# Patient Record
Sex: Female | Born: 1995 | Race: White | Hispanic: No | Marital: Single | Smoking: Former smoker
Health system: Southern US, Community
[De-identification: ages and names within clinical notes are randomized; demographics above are authoritative.]

---
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
## 2017-05-06 DIAGNOSIS — H52222 Regular astigmatism, left eye: Secondary | ICD-10-CM | POA: Diagnosis not present

## 2017-05-06 DIAGNOSIS — H5213 Myopia, bilateral: Secondary | ICD-10-CM | POA: Diagnosis not present

## 2017-08-05 ENCOUNTER — Encounter (HOSPITAL_COMMUNITY): Payer: Self-pay

## 2018-09-08 DIAGNOSIS — Z3A12 12 weeks gestation of pregnancy: Secondary | ICD-10-CM | POA: Diagnosis not present

## 2018-09-08 DIAGNOSIS — Z3689 Encounter for other specified antenatal screening: Secondary | ICD-10-CM | POA: Diagnosis not present

## 2018-09-08 DIAGNOSIS — O09291 Supervision of pregnancy with other poor reproductive or obstetric history, first trimester: Secondary | ICD-10-CM | POA: Diagnosis not present

## 2018-09-08 DIAGNOSIS — Z3682 Encounter for antenatal screening for nuchal translucency: Secondary | ICD-10-CM | POA: Diagnosis not present

## 2018-09-08 DIAGNOSIS — O3680X0 Pregnancy with inconclusive fetal viability, not applicable or unspecified: Secondary | ICD-10-CM | POA: Diagnosis not present

## 2018-10-06 DIAGNOSIS — Z3689 Encounter for other specified antenatal screening: Secondary | ICD-10-CM | POA: Diagnosis not present

## 2018-10-28 DIAGNOSIS — Z3A20 20 weeks gestation of pregnancy: Secondary | ICD-10-CM | POA: Diagnosis not present

## 2018-10-29 DIAGNOSIS — O09292 Supervision of pregnancy with other poor reproductive or obstetric history, second trimester: Secondary | ICD-10-CM | POA: Diagnosis not present

## 2018-10-29 DIAGNOSIS — Z363 Encounter for antenatal screening for malformations: Secondary | ICD-10-CM | POA: Diagnosis not present

## 2018-10-29 DIAGNOSIS — Z3A20 20 weeks gestation of pregnancy: Secondary | ICD-10-CM | POA: Diagnosis not present

## 2018-11-26 DIAGNOSIS — Z3689 Encounter for other specified antenatal screening: Secondary | ICD-10-CM | POA: Diagnosis not present

## 2018-11-26 DIAGNOSIS — O2686 Pruritic urticarial papules and plaques of pregnancy (PUPPP): Secondary | ICD-10-CM | POA: Diagnosis not present

## 2018-12-25 DIAGNOSIS — O09293 Supervision of pregnancy with other poor reproductive or obstetric history, third trimester: Secondary | ICD-10-CM | POA: Diagnosis not present

## 2018-12-25 DIAGNOSIS — Z3A28 28 weeks gestation of pregnancy: Secondary | ICD-10-CM | POA: Diagnosis not present

## 2018-12-25 DIAGNOSIS — Z3689 Encounter for other specified antenatal screening: Secondary | ICD-10-CM | POA: Diagnosis not present

## 2019-01-29 DIAGNOSIS — Z23 Encounter for immunization: Secondary | ICD-10-CM | POA: Diagnosis not present

## 2019-01-29 DIAGNOSIS — F331 Major depressive disorder, recurrent, moderate: Secondary | ICD-10-CM | POA: Diagnosis not present

## 2019-01-29 DIAGNOSIS — G43109 Migraine with aura, not intractable, without status migrainosus: Secondary | ICD-10-CM | POA: Diagnosis not present

## 2019-01-29 DIAGNOSIS — E162 Hypoglycemia, unspecified: Secondary | ICD-10-CM | POA: Diagnosis not present

## 2019-02-19 DIAGNOSIS — O99343 Other mental disorders complicating pregnancy, third trimester: Secondary | ICD-10-CM | POA: Diagnosis not present

## 2019-02-19 DIAGNOSIS — F419 Anxiety disorder, unspecified: Secondary | ICD-10-CM | POA: Diagnosis not present

## 2019-02-19 DIAGNOSIS — Z3A36 36 weeks gestation of pregnancy: Secondary | ICD-10-CM | POA: Diagnosis not present

## 2019-02-19 DIAGNOSIS — O09293 Supervision of pregnancy with other poor reproductive or obstetric history, third trimester: Secondary | ICD-10-CM | POA: Diagnosis not present

## 2019-02-26 DIAGNOSIS — O99820 Streptococcus B carrier state complicating pregnancy: Secondary | ICD-10-CM | POA: Diagnosis not present

## 2019-02-26 DIAGNOSIS — Z3A37 37 weeks gestation of pregnancy: Secondary | ICD-10-CM | POA: Diagnosis not present

## 2019-02-26 DIAGNOSIS — O368130 Decreased fetal movements, third trimester, not applicable or unspecified: Secondary | ICD-10-CM | POA: Diagnosis not present

## 2019-03-09 DIAGNOSIS — O99820 Streptococcus B carrier state complicating pregnancy: Secondary | ICD-10-CM | POA: Diagnosis not present

## 2019-03-09 DIAGNOSIS — Z3689 Encounter for other specified antenatal screening: Secondary | ICD-10-CM | POA: Diagnosis not present

## 2019-03-09 DIAGNOSIS — O09293 Supervision of pregnancy with other poor reproductive or obstetric history, third trimester: Secondary | ICD-10-CM | POA: Diagnosis not present

## 2019-03-09 DIAGNOSIS — Z3A39 39 weeks gestation of pregnancy: Secondary | ICD-10-CM | POA: Diagnosis not present

## 2019-03-12 DIAGNOSIS — O99820 Streptococcus B carrier state complicating pregnancy: Secondary | ICD-10-CM | POA: Diagnosis not present

## 2019-03-12 DIAGNOSIS — Z3A39 39 weeks gestation of pregnancy: Secondary | ICD-10-CM | POA: Diagnosis not present

## 2019-03-12 DIAGNOSIS — O1404 Mild to moderate pre-eclampsia, complicating childbirth: Secondary | ICD-10-CM | POA: Diagnosis not present

## 2019-04-23 DIAGNOSIS — Z30430 Encounter for insertion of intrauterine contraceptive device: Secondary | ICD-10-CM | POA: Diagnosis not present

## 2019-08-06 ENCOUNTER — Emergency Department (INDEPENDENT_AMBULATORY_CARE_PROVIDER_SITE_OTHER)
Admission: EM | Admit: 2019-08-06 | Discharge: 2019-08-06 | Disposition: A | Discharge status: Home / Self Care | Payer: Medicaid Other | Source: Home / Self Care

## 2019-08-06 ENCOUNTER — Other Ambulatory Visit: Payer: Self-pay

## 2019-08-06 ENCOUNTER — Encounter: Payer: Self-pay | Admitting: *Deleted

## 2019-08-06 ENCOUNTER — Emergency Department (INDEPENDENT_AMBULATORY_CARE_PROVIDER_SITE_OTHER): Payer: Medicaid Other

## 2019-08-06 DIAGNOSIS — M79672 Pain in left foot: Secondary | ICD-10-CM | POA: Diagnosis not present

## 2019-08-06 LAB — POCT URINE PREGNANCY: Preg Test, Ur: NEGATIVE

## 2019-08-06 NOTE — ED Provider Notes (Signed)
[facility]    CSN: [id number] Arrival date & time: 08/06/19  1406      History   Chief Complaint Chief Complaint  Patient presents with  . Foot Pain    HPI Mia Marsh is a 23 y.o. female.   HPI  Mia Marsh is a 23 y.o. female presenting to UC with c/o left foot pain that started about 2 days ago. Pain is aching and sore. Denies pain at rest but 4/10 with movement. No known injury but she reports returning back to work at [employer] recently since being on maternity leave. She is on her feel for long hours at a time and believes that is what has caused current pain.  She has not tried anything for her pain yet.    History reviewed. No pertinent past medical history.  Patient Active Problem List   Diagnosis Date Noted  . 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 No pertinent surgical history.  OB History    Gravida  1   Para      Term      Preterm      AB      Living        SAB      TAB      Ectopic      Multiple      Live Births               Home Medications    Prior to Admission medications   Medication Sig Start Date End Date Taking? Authorizing Provider  escitalopram (LEXAPRO) 20 MG tablet Take 20 mg by mouth daily. 04/07/19   [provider]    Family History History reviewed. No pertinent family history.  Social History Social History   Tobacco Use  . Smoking status: Former Games developer  . Smokeless tobacco: Never Used  Substance Use Topics  . Alcohol use: Never    Frequency: Never  . Drug use: Never     Allergies   Amoxicillin   Review of Systems Review of Systems  Musculoskeletal: Positive for arthralgias. Negative for gait problem, joint swelling and myalgias.  Skin: Negative for color change and wound.  Neurological: Negative for weakness and numbness.     Physical Exam Triage Vital Signs ED Triage Vitals [08/06/19 1429]  Enc Vitals Group     BP 103/70     Pulse Rate 82     Resp  16     Temp 98.5 F (36.9 C)     Temp Source Oral     SpO2 99 %     Weight 146 lb (66.2 kg)     Height 5\' 2"  (1.575 m)     Head Circumference      Peak Flow      Pain Score 4     Pain Loc      Pain Edu?      Excl. in GC?    No data found.  Updated Vital Signs BP 103/70 (BP Location: Right Arm)   Pulse 82   Temp 98.5 F (36.9 C) (Oral)   Resp 16   Ht 5\' 2"  (1.575 m)   Wt 146 lb (66.2 kg)   SpO2 99%   BMI 26.70 kg/m   Visual Acuity Right Eye Distance:   Left Eye Distance:   Bilateral Distance:    Right Eye Near:   Left Eye Near:    Bilateral Near:     Physical Exam Vitals signs and  nursing note reviewed.  Constitutional:      Appearance: Normal appearance. She is well-developed.  HENT:     Head: Normocephalic and atraumatic.  Neck:     Musculoskeletal: Normal range of motion.  Cardiovascular:     Rate and Rhythm: Normal rate.  Pulmonary:     Effort: Pulmonary effort is normal.  Musculoskeletal: Normal range of motion.        General: Tenderness present. No swelling.     Left foot: Normal range of motion. Bony tenderness present. No swelling, crepitus or deformity.       Feet:  Skin:    General: Skin is warm and dry.     Capillary Refill: Capillary refill takes less than 2 seconds.     Findings: No bruising or rash.  Neurological:     Mental Status: She is alert and oriented to person, place, and time.     Sensory: No sensory deficit.  Psychiatric:        Behavior: Behavior normal.      UC Treatments / Results  Labs (all labs ordered are listed, but only abnormal results are displayed) Labs Reviewed  POCT URINE PREGNANCY    EKG   Radiology Dg Foot Complete Left  Result Date: 08/06/2019 CLINICAL DATA:  Pain EXAM: LEFT FOOT - COMPLETE 3+ VIEW COMPARISON:  None. FINDINGS: Frontal, oblique, and lateral views were obtained. There is no evident fracture or dislocation. Joint spaces appear normal. No erosive change. No radiopaque foreign body.  IMPRESSION: No fracture or dislocation.  No evident arthropathy. Electronically Signed   By: Lowella Grip III M.D.   On: 08/06/2019 15:11    Procedures Procedures (including critical care time)  Medications Ordered in UC Medications - No data to display  Initial Impression / Assessment and Plan / UC Course  I have reviewed the triage vital signs and the nursing notes.  Pertinent labs & imaging results that were available during my care of the patient were reviewed by me and considered in my medical decision making (see chart for details).     Reassured pt of normal imaging No evidence of infection Pain likely from overuse injury Encouraged conservative tx F/u with PCP or Sports Medicine in 1-2 weeks if not improving.  Final Clinical Impressions(s) / UC Diagnoses   Final diagnoses:  Foot pain, left     Discharge Instructions      You may take 500mg  acetaminophen every 4-6 hours or in combination with ibuprofen 400-600mg  every 6-8 hours as needed for pain and inflammation.   Call to follow up with family medicine or sports medicine in 1-2 weeks if not improving using the phone number below.      ED Prescriptions    None     PDMP not reviewed this encounter.   [name], [place] 08/06/19 1643

## 2019-08-06 NOTE — Discharge Instructions (Signed)
°  You may take 500mg  acetaminophen every 4-6 hours or in combination with ibuprofen 400-600mg  every 6-8 hours as needed for pain and inflammation.   Call to follow up with family medicine or sports medicine in 1-2 weeks if not improving using the phone number below.

## 2019-08-06 NOTE — ED Triage Notes (Signed)
Pt c/o LT foot pain x 2 days. Denies injury.

## 2020-02-05 DIAGNOSIS — N925 Other specified irregular menstruation: Secondary | ICD-10-CM | POA: Diagnosis not present

## 2020-02-05 DIAGNOSIS — R5383 Other fatigue: Secondary | ICD-10-CM | POA: Diagnosis not present

## 2020-02-05 DIAGNOSIS — Z3202 Encounter for pregnancy test, result negative: Secondary | ICD-10-CM | POA: Diagnosis not present

## 2020-02-05 DIAGNOSIS — Z30011 Encounter for initial prescription of contraceptive pills: Secondary | ICD-10-CM | POA: Diagnosis not present

## 2020-02-05 DIAGNOSIS — R110 Nausea: Secondary | ICD-10-CM | POA: Diagnosis not present

## 2020-02-05 DIAGNOSIS — Z30014 Encounter for initial prescription of intrauterine contraceptive device: Secondary | ICD-10-CM | POA: Diagnosis not present

## 2020-02-05 DIAGNOSIS — N926 Irregular menstruation, unspecified: Secondary | ICD-10-CM | POA: Diagnosis not present

## 2020-02-13 IMAGING — DX DG FOOT COMPLETE 3+V*L*
3 series · 3 of 3 positions shown · non-contrast
Comparison: None.

CLINICAL DATA: Pain

EXAM:
LEFT FOOT - COMPLETE 3+ VIEW

[foot ap]
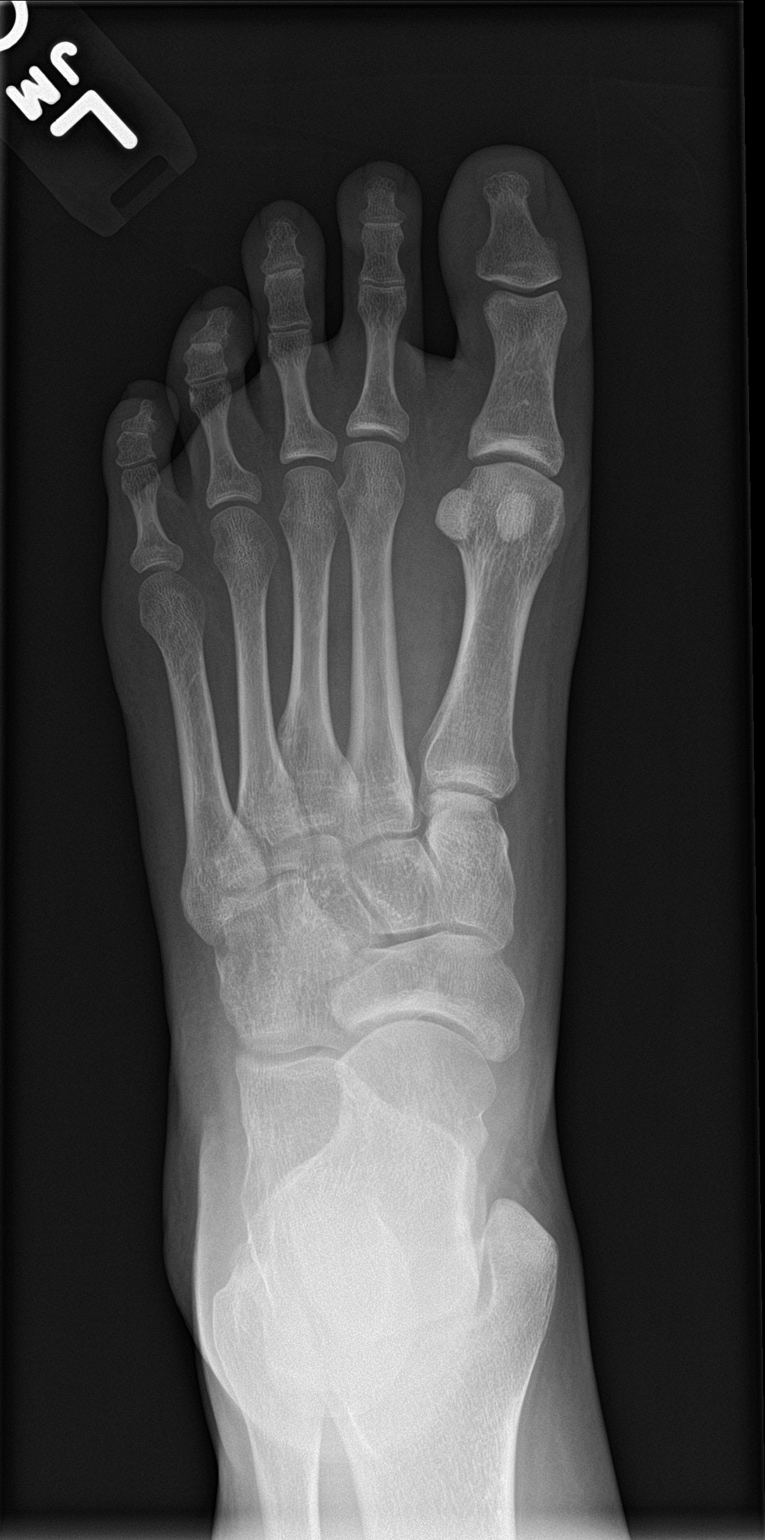

[foot obl]
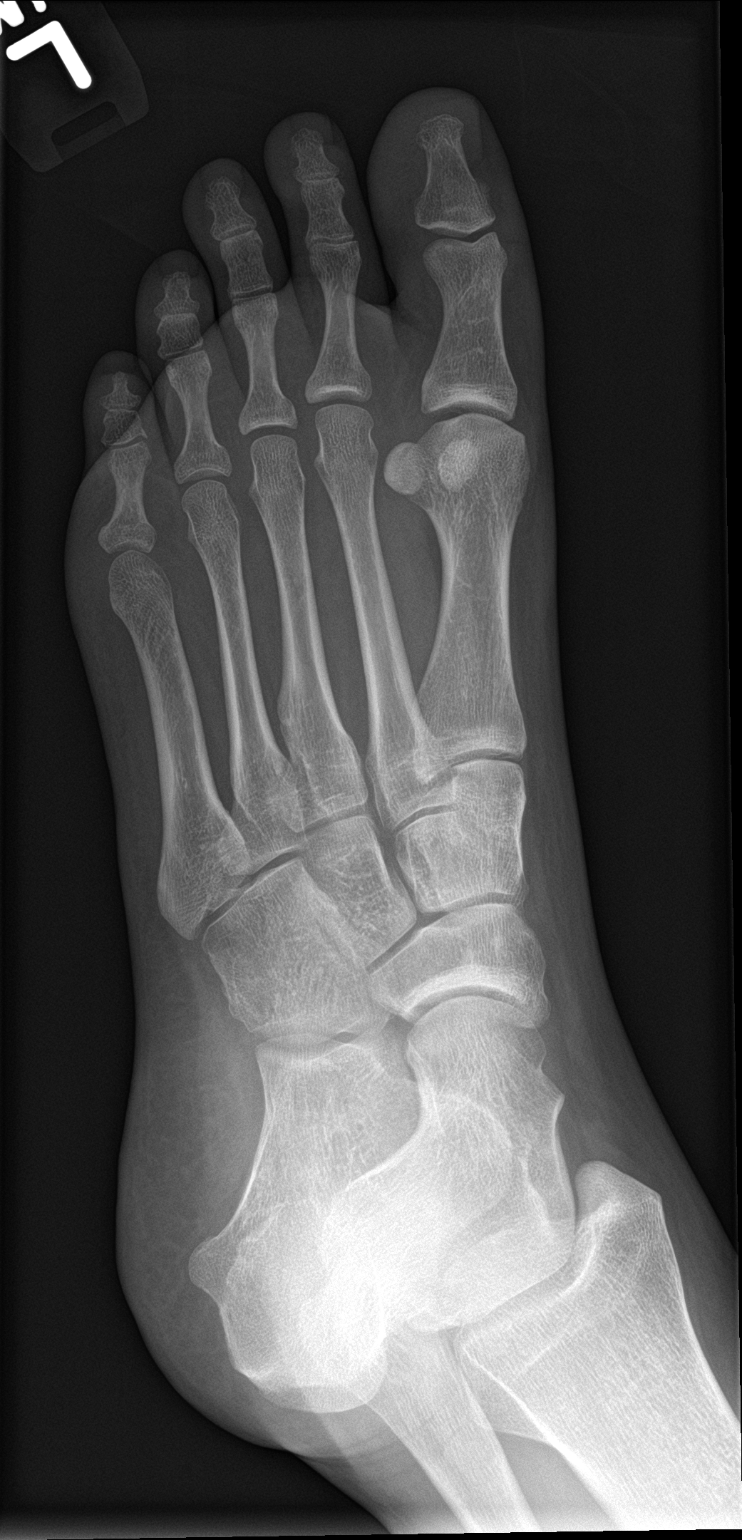

[foot lat]
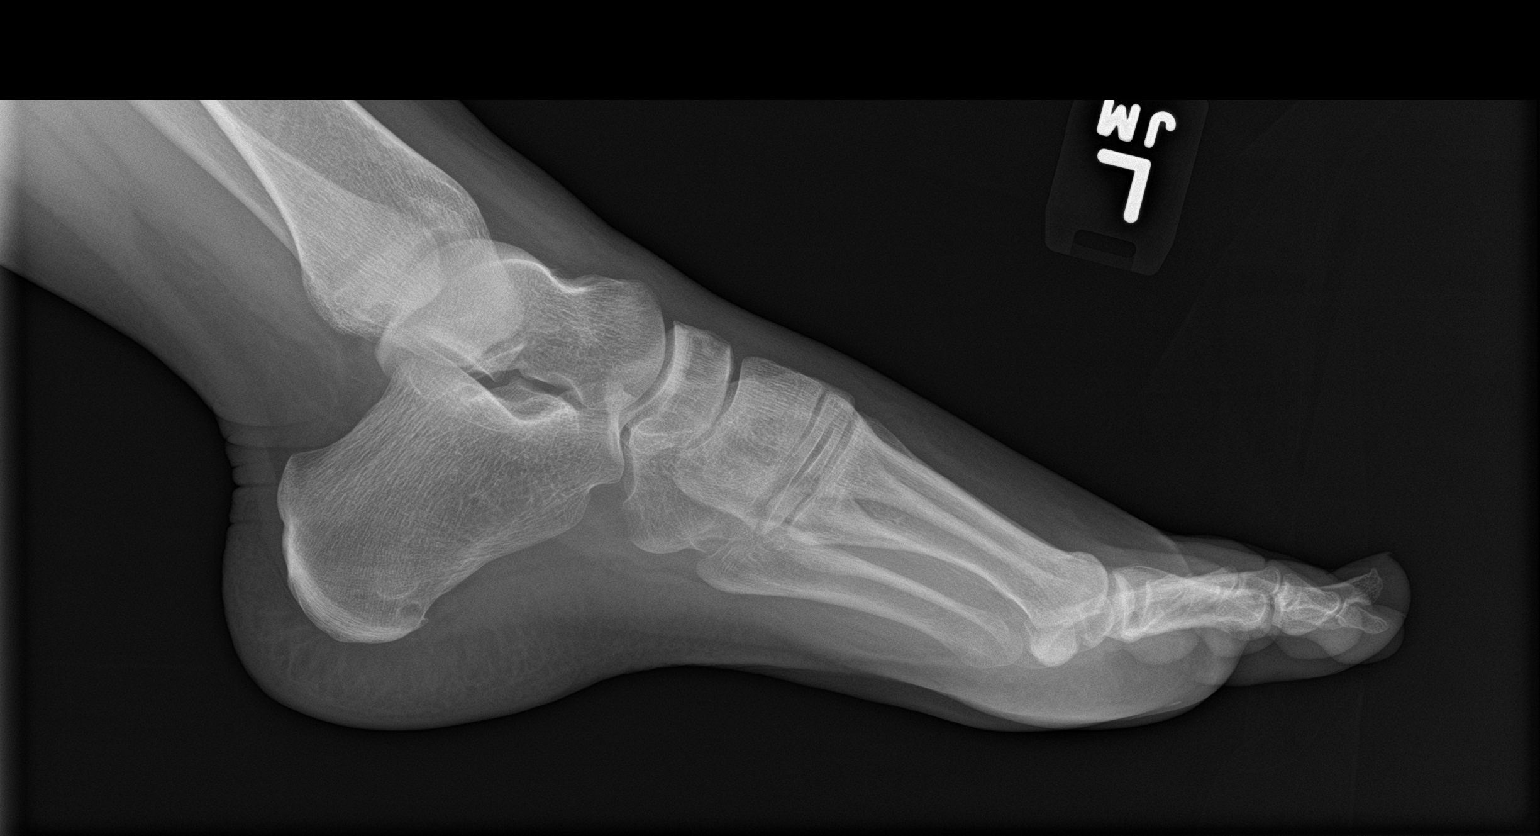

[3 of 3 positions shown; findings below may reference images not displayed]

FINDINGS: Frontal, oblique, and lateral views were obtained. There is no
evident fracture or dislocation. Joint spaces appear normal. No
erosive change. No radiopaque foreign body.
IMPRESSION: No fracture or dislocation.  No evident arthropathy.

## 2020-03-14 DIAGNOSIS — N926 Irregular menstruation, unspecified: Secondary | ICD-10-CM | POA: Diagnosis not present

## 2020-03-14 DIAGNOSIS — Z3041 Encounter for surveillance of contraceptive pills: Secondary | ICD-10-CM | POA: Diagnosis not present

## 2020-03-14 DIAGNOSIS — N925 Other specified irregular menstruation: Secondary | ICD-10-CM | POA: Diagnosis not present

## 2020-03-14 DIAGNOSIS — Z3202 Encounter for pregnancy test, result negative: Secondary | ICD-10-CM | POA: Diagnosis not present

## 2020-03-21 DIAGNOSIS — Z3202 Encounter for pregnancy test, result negative: Secondary | ICD-10-CM | POA: Diagnosis not present

## 2020-03-21 DIAGNOSIS — Z3042 Encounter for surveillance of injectable contraceptive: Secondary | ICD-10-CM | POA: Diagnosis not present

## 2020-08-30 DIAGNOSIS — N939 Abnormal uterine and vaginal bleeding, unspecified: Secondary | ICD-10-CM | POA: Diagnosis not present

## 2020-08-30 DIAGNOSIS — N926 Irregular menstruation, unspecified: Secondary | ICD-10-CM | POA: Diagnosis not present

## 2020-08-30 DIAGNOSIS — Z3042 Encounter for surveillance of injectable contraceptive: Secondary | ICD-10-CM | POA: Diagnosis not present

## 2020-08-30 DIAGNOSIS — Z3202 Encounter for pregnancy test, result negative: Secondary | ICD-10-CM | POA: Diagnosis not present

## 2020-08-30 DIAGNOSIS — E162 Hypoglycemia, unspecified: Secondary | ICD-10-CM | POA: Diagnosis not present

## 2020-08-30 DIAGNOSIS — R102 Pelvic and perineal pain: Secondary | ICD-10-CM | POA: Diagnosis not present

## 2020-09-07 DIAGNOSIS — Z20822 Contact with and (suspected) exposure to covid-19: Secondary | ICD-10-CM | POA: Diagnosis not present

## 2020-09-19 DIAGNOSIS — J069 Acute upper respiratory infection, unspecified: Secondary | ICD-10-CM | POA: Diagnosis not present

## 2020-09-19 DIAGNOSIS — Z20822 Contact with and (suspected) exposure to covid-19: Secondary | ICD-10-CM | POA: Diagnosis not present

## 2020-09-29 DIAGNOSIS — R059 Cough, unspecified: Secondary | ICD-10-CM | POA: Diagnosis not present

## 2020-09-29 DIAGNOSIS — U071 COVID-19: Secondary | ICD-10-CM | POA: Diagnosis not present

## 2020-10-05 DIAGNOSIS — U071 COVID-19: Secondary | ICD-10-CM | POA: Diagnosis not present

## 2020-10-05 DIAGNOSIS — R059 Cough, unspecified: Secondary | ICD-10-CM | POA: Diagnosis not present

## 2020-11-03 DIAGNOSIS — N939 Abnormal uterine and vaginal bleeding, unspecified: Secondary | ICD-10-CM | POA: Diagnosis not present

## 2020-11-03 DIAGNOSIS — Z3042 Encounter for surveillance of injectable contraceptive: Secondary | ICD-10-CM | POA: Diagnosis not present

## 2020-12-23 DIAGNOSIS — Z3202 Encounter for pregnancy test, result negative: Secondary | ICD-10-CM | POA: Diagnosis not present

## 2020-12-23 DIAGNOSIS — N920 Excessive and frequent menstruation with regular cycle: Secondary | ICD-10-CM | POA: Diagnosis not present

## 2020-12-23 DIAGNOSIS — Z3042 Encounter for surveillance of injectable contraceptive: Secondary | ICD-10-CM | POA: Diagnosis not present

## 2021-02-20 DIAGNOSIS — N912 Amenorrhea, unspecified: Secondary | ICD-10-CM | POA: Diagnosis not present

## 2021-02-22 DIAGNOSIS — J069 Acute upper respiratory infection, unspecified: Secondary | ICD-10-CM | POA: Diagnosis not present

## 2021-02-22 DIAGNOSIS — R059 Cough, unspecified: Secondary | ICD-10-CM | POA: Diagnosis not present

## 2021-03-06 DIAGNOSIS — Z3042 Encounter for surveillance of injectable contraceptive: Secondary | ICD-10-CM | POA: Diagnosis not present

## 2021-06-15 DIAGNOSIS — U071 COVID-19: Secondary | ICD-10-CM | POA: Diagnosis not present

## 2021-06-15 DIAGNOSIS — Z20822 Contact with and (suspected) exposure to covid-19: Secondary | ICD-10-CM | POA: Diagnosis not present

## 2021-07-21 DIAGNOSIS — N9412 Deep dyspareunia: Secondary | ICD-10-CM | POA: Diagnosis not present

## 2021-07-21 DIAGNOSIS — Z3202 Encounter for pregnancy test, result negative: Secondary | ICD-10-CM | POA: Diagnosis not present

## 2021-07-21 DIAGNOSIS — Z30011 Encounter for initial prescription of contraceptive pills: Secondary | ICD-10-CM | POA: Diagnosis not present

## 2021-10-16 DIAGNOSIS — U071 COVID-19: Secondary | ICD-10-CM | POA: Diagnosis not present

## 2021-10-16 DIAGNOSIS — R0602 Shortness of breath: Secondary | ICD-10-CM | POA: Diagnosis not present

## 2021-10-16 DIAGNOSIS — R051 Acute cough: Secondary | ICD-10-CM | POA: Diagnosis not present

## 2021-10-16 DIAGNOSIS — Z20822 Contact with and (suspected) exposure to covid-19: Secondary | ICD-10-CM | POA: Diagnosis not present

## 2021-10-25 DIAGNOSIS — R0602 Shortness of breath: Secondary | ICD-10-CM | POA: Diagnosis not present

## 2021-10-25 DIAGNOSIS — R079 Chest pain, unspecified: Secondary | ICD-10-CM | POA: Diagnosis not present

## 2021-10-25 DIAGNOSIS — Z3202 Encounter for pregnancy test, result negative: Secondary | ICD-10-CM | POA: Diagnosis not present

## 2021-10-25 DIAGNOSIS — U071 COVID-19: Secondary | ICD-10-CM | POA: Diagnosis not present

## 2021-10-26 DIAGNOSIS — G43919 Migraine, unspecified, intractable, without status migrainosus: Secondary | ICD-10-CM | POA: Diagnosis not present

## 2021-11-04 DIAGNOSIS — R519 Headache, unspecified: Secondary | ICD-10-CM | POA: Diagnosis not present

## 2021-11-04 DIAGNOSIS — G43919 Migraine, unspecified, intractable, without status migrainosus: Secondary | ICD-10-CM | POA: Diagnosis not present

## 2021-11-18 DIAGNOSIS — G43919 Migraine, unspecified, intractable, without status migrainosus: Secondary | ICD-10-CM | POA: Diagnosis not present

## 2021-11-18 DIAGNOSIS — R519 Headache, unspecified: Secondary | ICD-10-CM | POA: Diagnosis not present

## 2021-11-28 DIAGNOSIS — R519 Headache, unspecified: Secondary | ICD-10-CM | POA: Diagnosis not present

## 2021-12-06 DIAGNOSIS — R590 Localized enlarged lymph nodes: Secondary | ICD-10-CM | POA: Diagnosis not present

## 2021-12-06 DIAGNOSIS — R519 Headache, unspecified: Secondary | ICD-10-CM | POA: Diagnosis not present

## 2021-12-06 DIAGNOSIS — G43919 Migraine, unspecified, intractable, without status migrainosus: Secondary | ICD-10-CM | POA: Diagnosis not present

## 2021-12-21 DIAGNOSIS — R002 Palpitations: Secondary | ICD-10-CM | POA: Diagnosis not present

## 2021-12-21 DIAGNOSIS — R5383 Other fatigue: Secondary | ICD-10-CM | POA: Diagnosis not present

## 2021-12-21 DIAGNOSIS — E559 Vitamin D deficiency, unspecified: Secondary | ICD-10-CM | POA: Diagnosis not present

## 2021-12-21 DIAGNOSIS — Z0000 Encounter for general adult medical examination without abnormal findings: Secondary | ICD-10-CM | POA: Diagnosis not present

## 2021-12-21 DIAGNOSIS — R0602 Shortness of breath: Secondary | ICD-10-CM | POA: Diagnosis not present

## 2021-12-21 DIAGNOSIS — Z1159 Encounter for screening for other viral diseases: Secondary | ICD-10-CM | POA: Diagnosis not present

## 2021-12-21 DIAGNOSIS — Z20822 Contact with and (suspected) exposure to covid-19: Secondary | ICD-10-CM | POA: Diagnosis not present

## 2022-01-03 DIAGNOSIS — R7401 Elevation of levels of liver transaminase levels: Secondary | ICD-10-CM | POA: Diagnosis not present

## 2022-01-03 DIAGNOSIS — R1310 Dysphagia, unspecified: Secondary | ICD-10-CM | POA: Diagnosis not present

## 2022-01-03 DIAGNOSIS — Z7185 Encounter for immunization safety counseling: Secondary | ICD-10-CM | POA: Diagnosis not present

## 2022-01-03 DIAGNOSIS — G43919 Migraine, unspecified, intractable, without status migrainosus: Secondary | ICD-10-CM | POA: Diagnosis not present

## 2022-01-03 DIAGNOSIS — R590 Localized enlarged lymph nodes: Secondary | ICD-10-CM | POA: Diagnosis not present

## 2022-01-03 DIAGNOSIS — J309 Allergic rhinitis, unspecified: Secondary | ICD-10-CM | POA: Diagnosis not present

## 2023-07-27 ENCOUNTER — Ambulatory Visit
Admission: EM | Admit: 2023-07-27 | Discharge: 2023-07-27 | Disposition: A | Discharge status: Home / Self Care | Payer: Medicaid Other | Attending: Family Medicine | Admitting: Family Medicine

## 2023-07-27 ENCOUNTER — Other Ambulatory Visit: Payer: Self-pay

## 2023-07-27 DIAGNOSIS — R051 Acute cough: Secondary | ICD-10-CM | POA: Diagnosis not present

## 2023-07-27 MED ORDER — HYDROCOD POLI-CHLORPHE POLI ER 10-8 MG/5ML PO SUER: 5.0000 mL | Freq: Two times a day (BID) | ORAL | 0 refills | Status: AC | PRN

## 2023-07-27 MED ORDER — HYDROCOD POLI-CHLORPHE POLI ER 10-8 MG/5ML PO SUER: 5.0000 mL | Freq: Two times a day (BID) | ORAL | 0 refills | Status: DC | PRN

## 2023-07-27 NOTE — ED Provider Notes (Signed)
[facility]    CSN: [id number] Arrival date & time: 07/27/23  1254      History   Chief Complaint No chief complaint on file.   HPI Mia Marsh is a 27 y.o. female.   HPI  Patient is here for follow-up.  She was seen at an outside urgent care.  She is placed on amoxicillin and azithromycin and Tessalon.  She states that she is not improving, and still has severe coughing that keeps her awake.  She is here requesting a stronger cough medication.  History reviewed. No pertinent past medical history.  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 No pertinent surgical history.  OB History     Gravida  1   Para      Term      Preterm      AB      Living         SAB      IAB      Ectopic      Multiple      Live Births               Home Medications    Prior to Admission medications   Medication Sig Start Date End Date Taking? Authorizing Provider  levothyroxine (SYNTHROID) 25 MCG tablet Take 25 mcg by mouth daily before breakfast.   Yes [provider]  chlorpheniramine-HYDROcodone (TUSSIONEX) 10-8 MG/5ML Take 5 mLs by mouth every 12 (twelve) hours as needed for cough. 07/27/23   Eustace Moore, MD    Family History History reviewed. No pertinent family history.  Social History Social History   Tobacco Use   Smoking status: Former   Smokeless tobacco: Never  Vaping Use   Vaping status: Never Used  Substance Use Topics   Alcohol use: Never   Drug use: Never     Allergies   Amoxicillin   Review of Systems Review of Systems See HPI  Physical Exam Triage Vital Signs ED Triage Vitals  Encounter Vitals Group     BP 07/27/23 1259 115/80     Systolic BP Percentile --      Diastolic BP Percentile --      Pulse Rate 07/27/23 1259 95     Resp 07/27/23 1259 16     Temp 07/27/23 1259 98.2 F (36.8 C)     Temp Source 07/27/23 1259 Oral      SpO2 07/27/23 1259 99 %     Weight --      Height --      Head Circumference --      Peak Flow --      Pain Score 07/27/23 1301 6     Pain Loc --      Pain Education --      Exclude from Growth Chart --    No data found.  Updated Vital Signs BP 115/80   Pulse 95   Temp 98.2 F (36.8 C) (Oral)   Resp 16   SpO2 99%      Physical Exam Constitutional:      General: She is not in acute distress.    Appearance: She is well-developed. She is ill-appearing.  HENT:     Head: Normocephalic and atraumatic.  Eyes:     Conjunctiva/sclera: Conjunctivae normal.     Pupils: Pupils are equal, round, and reactive to light.  Cardiovascular:     Rate and  Rhythm: Normal rate.  Pulmonary:     Effort: Pulmonary effort is normal. No respiratory distress.     Breath sounds: Rhonchi present.     Comments: Breath sounds diminished in both bases.  Faint rhonchi Abdominal:     General: There is no distension.     Palpations: Abdomen is soft.  Musculoskeletal:        General: Normal range of motion.     Cervical back: Normal range of motion.  Skin:    General: Skin is warm and dry.  Neurological:     Mental Status: She is alert.      UC Treatments / Results  Labs (all labs ordered are listed, but only abnormal results are displayed) Labs Reviewed - No data to display  EKG   Radiology No results found.  Procedures Procedures (including critical care time)  Medications Ordered in UC Medications - No data to display  Initial Impression / Assessment and Plan / UC Course  I have reviewed the triage vital signs and the nursing notes.  Pertinent labs & imaging results that were available during my care of the patient were reviewed by me and considered in my medical decision making (see chart for details).    Final Clinical Impressions(s) / UC Diagnoses   Final diagnoses:  Acute cough     Discharge Instructions      Take antibiotics as directed Drink lots of water Use  tussionex as needed cough    ED Prescriptions     Medication Sig Dispense Auth. Provider   chlorpheniramine-HYDROcodone (TUSSIONEX) 10-8 MG/5ML Take 5 mLs by mouth every 12 (twelve) hours as needed for cough. 115 mL Eustace Moore, MD      I have reviewed the PDMP during this encounter.   Eustace Moore, MD 07/27/23 1425

## 2023-07-27 NOTE — Discharge Instructions (Signed)
Take antibiotics as directed Drink lots of water Use tussionex as needed cough

## 2023-07-27 NOTE — ED Triage Notes (Signed)
Dx with laryngitis and bronchitis yesterday, is already on azithromycin, prednison, tesslon perles. Reports cough is not relieved with tesslon perles, would like a liquid medication.
